# Patient Record
Sex: Male | Born: 2000 | Hispanic: Yes | Marital: Single | Smoking: Never smoker
Health system: Southern US, Community
[De-identification: ages and names within clinical notes are randomized; demographics above are authoritative.]

## PROBLEM LIST (undated history)

## (undated) DIAGNOSIS — Z789 Other specified health status: Secondary | ICD-10-CM

## (undated) HISTORY — PX: TONSILLECTOMY: SUR1361

---
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
## 2020-06-11 DIAGNOSIS — J019 Acute sinusitis, unspecified: Secondary | ICD-10-CM | POA: Diagnosis not present

## 2020-06-11 DIAGNOSIS — H66002 Acute suppurative otitis media without spontaneous rupture of ear drum, left ear: Secondary | ICD-10-CM | POA: Diagnosis not present

## 2020-06-11 DIAGNOSIS — B9689 Other specified bacterial agents as the cause of diseases classified elsewhere: Secondary | ICD-10-CM | POA: Diagnosis not present

## 2020-07-08 ENCOUNTER — Other Ambulatory Visit: Payer: Self-pay

## 2020-07-08 ENCOUNTER — Emergency Department
Admission: EM | Admit: 2020-07-08 | Discharge: 2020-07-08 | Disposition: A | Discharge status: Home / Self Care | Payer: Commercial Managed Care - PPO | Attending: Emergency Medicine | Admitting: Emergency Medicine

## 2020-07-08 ENCOUNTER — Encounter: Payer: Self-pay | Admitting: Emergency Medicine

## 2020-07-08 DIAGNOSIS — S61431A Puncture wound without foreign body of right hand, initial encounter: Secondary | ICD-10-CM | POA: Diagnosis not present

## 2020-07-08 DIAGNOSIS — W540XXA Bitten by dog, initial encounter: Secondary | ICD-10-CM | POA: Diagnosis not present

## 2020-07-08 DIAGNOSIS — Y999 Unspecified external cause status: Secondary | ICD-10-CM | POA: Diagnosis not present

## 2020-07-08 DIAGNOSIS — Y939 Activity, unspecified: Secondary | ICD-10-CM | POA: Diagnosis not present

## 2020-07-08 DIAGNOSIS — Z23 Encounter for immunization: Secondary | ICD-10-CM | POA: Insufficient documentation

## 2020-07-08 DIAGNOSIS — Y929 Unspecified place or not applicable: Secondary | ICD-10-CM | POA: Diagnosis not present

## 2020-07-08 DIAGNOSIS — S61451A Open bite of right hand, initial encounter: Secondary | ICD-10-CM | POA: Diagnosis not present

## 2020-07-08 MED ORDER — AMOXICILLIN-POT CLAVULANATE 875-125 MG PO TABS
1.0000 | ORAL_TABLET | Freq: Once | ORAL | Status: AC
  Administered 2020-07-08: 1 via ORAL
  Filled 2020-07-08: qty 1

## 2020-07-08 MED ORDER — AMOXICILLIN-POT CLAVULANATE 875-125 MG PO TABS: 1.0000 | ORAL_TABLET | Freq: Two times a day (BID) | ORAL | 0 refills | Status: AC

## 2020-07-08 MED ORDER — BACITRACIN-NEOMYCIN-POLYMYXIN 400-5-5000 EX OINT
TOPICAL_OINTMENT | Freq: Every day | CUTANEOUS | Status: DC
  Filled 2020-07-08: qty 1

## 2020-07-08 MED ORDER — TETANUS-DIPHTH-ACELL PERTUSSIS 5-2.5-18.5 LF-MCG/0.5 IM SUSP
0.5000 mL | Freq: Once | INTRAMUSCULAR | Status: AC
  Administered 2020-07-08: 0.5 mL via INTRAMUSCULAR
  Filled 2020-07-08: qty 0.5

## 2020-07-08 NOTE — ED Triage Notes (Signed)
Pt to ED via POV c/o dog bite. Pt states that his dogs were fighting and he had to pull them apart. Pt has bite marks on his right hand. Pt is in NAD. Pt states that he did not call to report the bite, dogs are UTD on shots per pt.

## 2020-07-08 NOTE — ED Provider Notes (Signed)
[facility] Emergency Department Provider Note ____________________________________________  Time seen: 0900  I have reviewed the triage vital signs and the nursing notes.  HISTORY  Chief Complaint  Animal Bite   HPI Thomas Villanueva is a 19 y.o. male presents to the clinic today with c/o dog bite to his right hand. He reports he was trying to put a bowl of water down when his 2 dogs started fighting. He tried to separate them and was bitten on the right hand. He reports multiple puncture wounds. He describes the pain as burning. He denies numbness, tingling or weakness to the right hand. He reports they were his dogs and they are UTD on their vaccines. He did not take any medication or treatment PTA. His last tetanus was in 2016.  History reviewed. No pertinent past medical history.  There are no problems to display for this patient.   Past Surgical History:  Procedure Laterality Date  . TONSILLECTOMY      Prior to Admission medications   Medication Sig Start Date End Date Taking? Authorizing Provider  amoxicillin-clavulanate (AUGMENTIN) 875-125 MG tablet Take 1 tablet by mouth 2 (two) times daily for 10 days. 07/08/20 07/18/20  Lorre Munroe, NP    Allergies Patient has no known allergies.  Family History  Problem Relation Age of Onset  . Diabetes Paternal Grandmother     Social History Social History   Tobacco Use  . Smoking status: Never Smoker  . Smokeless tobacco: Never Used  Substance Use Topics  . Alcohol use: Yes    Alcohol/week: 0.0 standard drinks  . Drug use: Not Currently    Review of Systems  Constitutional: Negative for fever, chills or body aches. Cardiovascular: Negative for chest pain or chest tightness. Respiratory: Negative for cough or shortness of breath. Musculoskeletal: Positive for right hand pain. Negative for right hand swelling Skin: Positive for dog bite to right hand. Neurological: Negative for  headaches, focal weakness, tingling or numbness. ____________________________________________  PHYSICAL EXAM:  VITAL SIGNS: ED Triage Vitals  Enc Vitals Group     BP 07/08/20 0839 117/80     Pulse Rate 07/08/20 0839 100     Resp 07/08/20 0839 16     Temp 07/08/20 0839 98.7 F (37.1 C)     Temp Source 07/08/20 0839 Oral     SpO2 07/08/20 0839 99 %     Weight 07/08/20 0840 172 lb (78 kg)     Height 07/08/20 0840 5\' 10"  (1.778 m)     Head Circumference --      Peak Flow --      Pain Score 07/08/20 0840 7     Pain Loc --      Pain Edu? --      Excl. in GC? --     Constitutional: Alert and oriented. Well appearing and in no distress. Head: Normocephalic and atraumatic. Eyes: Conjunctivae are normal. PERRL. Normal extraocular movements Cardiovascular: Normal rate, regular rhythm. Radial pulses 2+ bilaterally Respiratory: Normal respiratory effort. No wheezes/rales/rhonchi. Musculoskeletal: Normal flexion, extension and rotation of the right wrist. Normal flexion extension of the fingers. No joint swelling noted. Handgrips equal. Neurologic:  Normal gait without ataxia. Normal speech and language. No gross focal neurologic deficits are appreciated. Skin: Puncture wound noted 1st and 4th metacarpals, PIP 3rd finger and base of right thumb. ____________________________________________  INITIAL IMPRESSION / ASSESSMENT AND PLAN / ED COURSE  Dog Bite, Right Hand:  Puncture wounds superficial, no indication for xray at  this time Tdap given Augmentin 875-125 mg PO x 1 Wound care provided RX for Augmentin 875-125 mg PO BID x 10 days ____________________________________________  FINAL CLINICAL IMPRESSION(S) / ED DIAGNOSES  Final diagnoses:  Dog bite of right hand, initial encounter   Nicki Reaper, NP    Lorre Munroe, NP 07/08/20 9826    Concha Se, MD 07/08/20 1316

## 2020-07-08 NOTE — ED Notes (Signed)
This RN called [employer] to inform them of animal bite. Per secretary patient is ok to discharge at this time

## 2020-07-08 NOTE — Discharge Instructions (Addendum)
You were seen today for a dog bite of your right hand.  Your wounds were cleansed and dressed.  You received a tetanus injection today.  You received your first dose of antibiotics in the ER.  I have sent you in a prescription to continue antibiotics twice daily for the next 10 days.  Monitor for increased pain, swelling, redness or purulent discharge as well as fever, chills or body aches.  Follow-up with your PCP if symptoms persist or worsen.

## 2020-07-08 NOTE — ED Notes (Signed)
Pt ambulatory from triage in NAD. PT reports he was breaking up a fight between his two dogs and the dog bit his right hand. Pt has multiple cuts to the right hand, bleeding controlled. Pt reports both dogs are utd on vaccines. Pt thinks he has a tetanus shot last year but is unsure.

## 2020-09-14 ENCOUNTER — Other Ambulatory Visit: Payer: Self-pay

## 2020-09-14 ENCOUNTER — Emergency Department: Payer: Medicaid Other

## 2020-09-14 DIAGNOSIS — Y9389 Activity, other specified: Secondary | ICD-10-CM | POA: Insufficient documentation

## 2020-09-14 DIAGNOSIS — Z5321 Procedure and treatment not carried out due to patient leaving prior to being seen by health care provider: Secondary | ICD-10-CM | POA: Diagnosis not present

## 2020-09-14 DIAGNOSIS — Y92410 Unspecified street and highway as the place of occurrence of the external cause: Secondary | ICD-10-CM | POA: Diagnosis not present

## 2020-09-14 DIAGNOSIS — Z041 Encounter for examination and observation following transport accident: Secondary | ICD-10-CM | POA: Insufficient documentation
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
## 2020-09-15 ENCOUNTER — Emergency Department
Admission: EM | Admit: 2020-09-15 | Discharge: 2020-09-15 | Disposition: A | Discharge status: Home / Self Care | Payer: Medicaid Other | Attending: Emergency Medicine | Admitting: Emergency Medicine

## 2020-09-17 ENCOUNTER — Telehealth: Payer: Self-pay | Admitting: Emergency Medicine

## 2020-09-17 NOTE — Telephone Encounter (Signed)
Called to inform of fx nose.  No answer and voicemail full .

## 2020-09-17 NOTE — Telephone Encounter (Signed)
Patient called back to interpreter phone.  He does speak english, so she transferred the call to me.  I explained nasal fracture seen on CT and advised patient to call pcp and have them review the CT--and they can tell him any next steps.  He agrees to call now.

## 2020-09-21 DIAGNOSIS — S022XXA Fracture of nasal bones, initial encounter for closed fracture: Secondary | ICD-10-CM | POA: Diagnosis not present

## 2020-09-24 ENCOUNTER — Other Ambulatory Visit: Payer: Self-pay

## 2020-09-24 ENCOUNTER — Other Ambulatory Visit
Admission: RE | Admit: 2020-09-24 | Discharge: 2020-09-24 | Disposition: A | Discharge status: Home / Self Care | Payer: Medicaid Other | Source: Ambulatory Visit | Attending: Otolaryngology | Admitting: Otolaryngology

## 2020-09-24 ENCOUNTER — Encounter: Payer: Self-pay | Admitting: Otolaryngology

## 2020-09-24 DIAGNOSIS — Z20822 Contact with and (suspected) exposure to covid-19: Secondary | ICD-10-CM | POA: Insufficient documentation

## 2020-09-24 DIAGNOSIS — Z01812 Encounter for preprocedural laboratory examination: Secondary | ICD-10-CM | POA: Diagnosis not present

## 2020-09-24 LAB — SARS CORONAVIRUS 2 (TAT 6-24 HRS): SARS Coronavirus 2: NEGATIVE

## 2020-09-25 DIAGNOSIS — H5213 Myopia, bilateral: Secondary | ICD-10-CM | POA: Diagnosis not present

## 2020-09-25 NOTE — Anesthesia Preprocedure Evaluation (Addendum)
Anesthesia Evaluation  Patient identified by MRN, date of birth, ID band Patient awake    Reviewed: Allergy & Precautions, NPO status , Patient's Chart, lab work & pertinent test results  History of Anesthesia Complications Negative for: history of anesthetic complications  Airway Mallampati: III   Neck ROM: Full    Dental no notable dental hx.    Pulmonary neg pulmonary ROS,    Pulmonary exam normal breath sounds clear to auscultation       Cardiovascular Exercise Tolerance: Good negative cardio ROS Normal cardiovascular exam Rhythm:Regular Rate:Normal     Neuro/Psych negative neurological ROS     GI/Hepatic negative GI ROS,   Endo/Other  negative endocrine ROS  Renal/GU negative Renal ROS     Musculoskeletal   Abdominal   Peds  Hematology negative hematology ROS (+)   Anesthesia Other Findings   Reproductive/Obstetrics                             Anesthesia Physical Anesthesia Plan  ASA: I  Anesthesia Plan: General   Post-op Pain Management:    Induction: Intravenous  PONV Risk Score and Plan: 2 and Treatment may vary due to age or medical condition, Ondansetron and Dexamethasone  Airway Management Planned: LMA  Additional Equipment:   Intra-op Plan:   Post-operative Plan: Extubation in OR  Informed Consent: I have reviewed the patients History and Physical, chart, labs and discussed the procedure including the risks, benefits and alternatives for the proposed anesthesia with the patient or authorized representative who has indicated his/her understanding and acceptance.       Plan Discussed with: CRNA  Anesthesia Plan Comments:       Anesthesia Quick Evaluation

## 2020-09-26 ENCOUNTER — Ambulatory Visit: Payer: Medicaid Other | Admitting: Anesthesiology

## 2020-09-26 ENCOUNTER — Encounter
Admission: RE | Disposition: A | Discharge status: Home / Self Care | Payer: Self-pay | Source: Home / Self Care | Attending: Otolaryngology

## 2020-09-26 ENCOUNTER — Ambulatory Visit
Admission: RE | Admit: 2020-09-26 | Discharge: 2020-09-26 | Disposition: A | Discharge status: Home / Self Care | Payer: Medicaid Other | Attending: Otolaryngology | Admitting: Otolaryngology

## 2020-09-26 ENCOUNTER — Encounter: Payer: Self-pay | Admitting: Otolaryngology

## 2020-09-26 ENCOUNTER — Other Ambulatory Visit: Payer: Self-pay

## 2020-09-26 DIAGNOSIS — S022XXA Fracture of nasal bones, initial encounter for closed fracture: Secondary | ICD-10-CM | POA: Diagnosis not present

## 2020-09-26 DIAGNOSIS — J342 Deviated nasal septum: Secondary | ICD-10-CM | POA: Insufficient documentation

## 2020-09-26 HISTORY — PX: CLOSED REDUCTION NASAL FRACTURE: SHX5365

## 2020-09-26 HISTORY — DX: Other specified health status: Z78.9

## 2020-09-26 SURGERY — CLOSED REDUCTION, FRACTURE, NASAL BONE
Anesthesia: General | Site: Nose

## 2020-09-26 MED ORDER — LIDOCAINE-EPINEPHRINE 1 %-1:100000 IJ SOLN
INTRAMUSCULAR | Status: DC | PRN
  Administered 2020-09-26: 4 mL

## 2020-09-26 MED ORDER — ONDANSETRON HCL 4 MG PO TABS: 4.0000 mg | ORAL_TABLET | Freq: Three times a day (TID) | ORAL | 0 refills | Status: AC | PRN

## 2020-09-26 MED ORDER — MIDAZOLAM HCL 5 MG/5ML IJ SOLN
INTRAMUSCULAR | Status: DC | PRN
  Administered 2020-09-26: 2 mg via INTRAVENOUS

## 2020-09-26 MED ORDER — OXYMETAZOLINE HCL 0.05 % NA SOLN
NASAL | Status: DC | PRN
  Administered 2020-09-26: 1 via TOPICAL

## 2020-09-26 MED ORDER — DEXAMETHASONE SODIUM PHOSPHATE 4 MG/ML IJ SOLN
INTRAMUSCULAR | Status: DC | PRN
  Administered 2020-09-26: 10 mg via INTRAVENOUS

## 2020-09-26 MED ORDER — ACETAMINOPHEN 10 MG/ML IV SOLN
1000.0000 mg | Freq: Once | INTRAVENOUS | Status: AC
  Administered 2020-09-26: 1000 mg via INTRAVENOUS

## 2020-09-26 MED ORDER — PROPOFOL 10 MG/ML IV BOLUS
INTRAVENOUS | Status: DC | PRN
  Administered 2020-09-26: 150 mg via INTRAVENOUS

## 2020-09-26 MED ORDER — AMOXICILLIN 500 MG PO CAPS: 500.0000 mg | ORAL_CAPSULE | Freq: Three times a day (TID) | ORAL | 0 refills | Status: AC

## 2020-09-26 MED ORDER — LACTATED RINGERS IV SOLN: INTRAVENOUS | Status: DC

## 2020-09-26 MED ORDER — ONDANSETRON HCL 4 MG/2ML IJ SOLN
INTRAMUSCULAR | Status: DC | PRN
  Administered 2020-09-26: 4 mg via INTRAVENOUS

## 2020-09-26 MED ORDER — LIDOCAINE HCL (CARDIAC) PF 100 MG/5ML IV SOSY
PREFILLED_SYRINGE | INTRAVENOUS | Status: DC | PRN
  Administered 2020-09-26: 50 mg via INTRATRACHEAL

## 2020-09-26 MED ORDER — GLYCOPYRROLATE 0.2 MG/ML IJ SOLN
INTRAMUSCULAR | Status: DC | PRN
  Administered 2020-09-26: .1 mg via INTRAVENOUS

## 2020-09-26 MED ORDER — HYDROCODONE-ACETAMINOPHEN 5-325 MG PO TABS
1.0000 | ORAL_TABLET | ORAL | 0 refills | Status: AC | PRN
Start: 2020-09-26 — End: ?

## 2020-09-26 MED ORDER — FENTANYL CITRATE (PF) 100 MCG/2ML IJ SOLN
INTRAMUSCULAR | Status: DC | PRN
  Administered 2020-09-26: 50 ug via INTRAVENOUS

## 2020-09-26 SURGICAL SUPPLY — 23 items
ADH LQ OCL WTPRF AMP STRL LF (MISCELLANEOUS) ×1
ADHESIVE MASTISOL STRL (MISCELLANEOUS) ×3 IMPLANT
AQUAPLAST 3X3 FLAT (MISCELLANEOUS) ×3
BASIN GRAD PLASTIC 32OZ STRL (MISCELLANEOUS) ×3 IMPLANT
CANISTER SUCT 1200ML W/VALVE (MISCELLANEOUS) ×3 IMPLANT
CLOSURE WOUND 1/2 X4 (GAUZE/BANDAGES/DRESSINGS) ×1
COVER MAYO STAND STRL (DRAPES) ×3 IMPLANT
COVER TABLE BACK 60X90 (DRAPES) ×1 IMPLANT
CUP MEDICINE 2OZ PLAST GRAD ST (MISCELLANEOUS) ×3 IMPLANT
GAUZE SPONGE 4X4 12PLY STRL (GAUZE/BANDAGES/DRESSINGS) ×3 IMPLANT
GLOVE BIO SURGEON STRL SZ7.5 (GLOVE) ×3 IMPLANT
KIT TURNOVER KIT A (KITS) ×3 IMPLANT
MARKER SKIN DUAL TIP RULER LAB (MISCELLANEOUS) ×3 IMPLANT
NDL HYPO 25GX1X1/2 BEV (NEEDLE) ×1 IMPLANT
NEEDLE HYPO 25GX1X1/2 BEV (NEEDLE) ×3 IMPLANT
PATTIES SURGICAL .5 X3 (DISPOSABLE) ×3 IMPLANT
SPLINT AQUAPLAST 3X3 FLAT (MISCELLANEOUS) IMPLANT
STRAP BODY AND KNEE 60X3 (MISCELLANEOUS) ×3 IMPLANT
STRIP CLOSURE SKIN 1/2X4 (GAUZE/BANDAGES/DRESSINGS) ×2 IMPLANT
SYR 10ML LL (SYRINGE) ×3 IMPLANT
TOWEL OR 17X26 4PK STRL BLUE (TOWEL DISPOSABLE) ×3 IMPLANT
TUBING CONN 6MMX3.1M (TUBING) ×2
TUBING SUCTION CONN 0.25 STRL (TUBING) ×1 IMPLANT

## 2020-09-26 NOTE — Op Note (Signed)
..  09/26/2020  7:31 AM    Estudillo Windell [place]  [id number]   Pre-Op Dx:  nasal fracture  Post-op Dx: nasal fracture  Proc:Closed reduction of nasal bone and septal fracture  Surg: Roney Mans Haylyn Halberg  Anes:  Laryngeal mask  EBL:  None  Comp:  None  Findings:  Left sided nasal side wall fracture with convexity, left sided septal deviation from septal fracture.  Both fractures successfully reduced.  Small merocel sponge placed in left nasal cavity for stability of septal fracture.  Procedure: With the patient in a comfortable supine position, general laryngeal mask anesthesia was administered.  At an appropriate level,the patient's nasal cavity was evaluated and noted to have a left sided convexity of a comminuted nasal bone fracture as well as left ward deviation of nasal septum from a septal fracture.  The patient septum and inferior turbinates were anesthetized with 5 ccs of 1% lidocaine with 1:100,000 epinephrine.  Using an elevator, the left nasal bone was reduced for normal anatomical position with resolution of stepoff of left nasal sidewall.  The nasal septum was reduced rightward as well with elevator and large nasal speculum.  Due to concern that the septum may migrate back, two small merocel sponges with tie were placed into the patient's left nasal cavity and inflated with Afrin.  This resulted in good cosmetic result.  A thermoplast type splint was fashioned and placed in the normal fashion.  Following this  The patient was returned to anesthesia, awakened, and transferred to recovery in stable condition.  Dispo:  PACU to home  Plan: Antibiotics, follow up in 1 week.Hennie Duos my office three weeks.   Roney Mans Damaya Channing 7:31 AM 09/26/2020

## 2020-09-26 NOTE — Anesthesia Procedure Notes (Signed)
Procedure Name: LMA Insertion Date/Time: 09/26/2020 7:16 AM Performed by: Jimmy Picket, CRNA Pre-anesthesia Checklist: Patient identified, Emergency Drugs available, Suction available, Timeout performed and Patient being monitored Patient Re-evaluated:Patient Re-evaluated prior to induction Oxygen Delivery Method: Circle system utilized Preoxygenation: Pre-oxygenation with 100% oxygen Induction Type: IV induction LMA: LMA inserted LMA Size: 4.0 Number of attempts: 1 Placement Confirmation: positive ETCO2 and breath sounds checked- equal and bilateral Tube secured with: Tape

## 2020-09-26 NOTE — Transfer of Care (Signed)
Immediate Anesthesia Transfer of Care Note  Patient: Thomas Villanueva  Procedure(s) Performed: CLOSED REDUCTION NASAL FRACTURE (N/A Nose)  Patient Location: PACU  Anesthesia Type: General  Level of Consciousness: awake, alert  and patient cooperative  Airway and Oxygen Therapy: Patient Spontanous Breathing and Patient connected to supplemental oxygen  Post-op Assessment: Post-op Vital signs reviewed, Patient's Cardiovascular Status Stable, Respiratory Function Stable, Patent Airway and No signs of Nausea or vomiting  Post-op Vital Signs: Reviewed and stable  Complications: No complications documented.

## 2020-09-26 NOTE — Anesthesia Postprocedure Evaluation (Signed)
Anesthesia Post Note  Patient: Thomas Villanueva  Procedure(s) Performed: CLOSED REDUCTION NASAL FRACTURE (N/A Nose)     Patient location during evaluation: PACU Anesthesia Type: General Level of consciousness: awake and alert, oriented and patient cooperative Pain management: pain level controlled Vital Signs Assessment: post-procedure vital signs reviewed and stable Respiratory status: spontaneous breathing, nonlabored ventilation and respiratory function stable Cardiovascular status: blood pressure returned to baseline and stable Postop Assessment: adequate PO intake Anesthetic complications: no   No complications documented.  Reed Breech

## 2020-09-26 NOTE — H&P (Signed)
..  History and Physical paper copy reviewed and updated date of procedure and will be scanned into system.  Patient seen and examined.  

## 2021-05-07 DIAGNOSIS — Z23 Encounter for immunization: Secondary | ICD-10-CM | POA: Diagnosis not present

## 2021-05-07 DIAGNOSIS — S51812A Laceration without foreign body of left forearm, initial encounter: Secondary | ICD-10-CM | POA: Diagnosis not present
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
## 2021-07-10 DIAGNOSIS — Z20822 Contact with and (suspected) exposure to covid-19: Secondary | ICD-10-CM | POA: Diagnosis not present

## 2021-07-10 DIAGNOSIS — U071 COVID-19: Secondary | ICD-10-CM | POA: Diagnosis not present

## 2023-02-24 DIAGNOSIS — R519 Headache, unspecified: Secondary | ICD-10-CM | POA: Diagnosis not present

## 2023-09-11 DIAGNOSIS — M5441 Lumbago with sciatica, right side: Secondary | ICD-10-CM | POA: Diagnosis not present

## 2023-10-19 ENCOUNTER — Other Ambulatory Visit: Payer: Self-pay | Admitting: Pain Medicine

## 2023-10-19 DIAGNOSIS — M25572 Pain in left ankle and joints of left foot: Secondary | ICD-10-CM

## 2023-10-19 DIAGNOSIS — M47816 Spondylosis without myelopathy or radiculopathy, lumbar region: Secondary | ICD-10-CM

## 2023-10-31 ENCOUNTER — Other Ambulatory Visit: Payer: Medicaid Other

## 2023-11-14 ENCOUNTER — Ambulatory Visit
Admission: RE | Admit: 2023-11-14 | Discharge: 2023-11-14 | Disposition: A | Discharge status: Home / Self Care | Payer: No Typology Code available for payment source | Source: Ambulatory Visit | Attending: Pain Medicine | Admitting: Pain Medicine

## 2023-11-14 DIAGNOSIS — M47816 Spondylosis without myelopathy or radiculopathy, lumbar region: Secondary | ICD-10-CM

## 2023-11-14 DIAGNOSIS — M25572 Pain in left ankle and joints of left foot: Secondary | ICD-10-CM
